# Patient Record
Sex: Male | Born: 1995 | Race: White | Hispanic: No | Marital: Single | Smoking: Current every day smoker
Health system: Southern US, Community
[De-identification: ages and names within clinical notes are randomized; demographics above are authoritative.]

## PROBLEM LIST (undated history)

## (undated) DIAGNOSIS — Z789 Other specified health status: Secondary | ICD-10-CM

## (undated) HISTORY — PX: TONSILLECTOMY: SUR1361

---
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
## 2013-01-03 NOTE — ED Provider Notes (Signed)
Medical screening examination/treatment/procedure(s) were performed by non-physician practitioner and as supervising physician I was immediately available for consultation/collaboration.   Benny Lennert, MD 01/03/13 [phone]
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
## 2015-06-28 ENCOUNTER — Encounter: Payer: Self-pay | Admitting: Family Medicine

## 2015-06-28 ENCOUNTER — Ambulatory Visit (INDEPENDENT_AMBULATORY_CARE_PROVIDER_SITE_OTHER): Payer: 59 | Admitting: Family Medicine

## 2015-06-28 VITALS — BP 125/82 | HR 74 | Temp 97.2°F | Ht 74.0 in | Wt 193.8 lb

## 2015-06-28 DIAGNOSIS — Z0000 Encounter for general adult medical examination without abnormal findings: Secondary | ICD-10-CM | POA: Diagnosis not present

## 2015-06-28 DIAGNOSIS — G894 Chronic pain syndrome: Secondary | ICD-10-CM
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
## 2015-07-04 ENCOUNTER — Telehealth: Payer: Self-pay | Admitting: Family Medicine

## 2015-07-06 NOTE — Telephone Encounter (Signed)
Pt aware  Referral has not been done yet

## 2015-08-26 ENCOUNTER — Emergency Department (HOSPITAL_COMMUNITY): Payer: 59

## 2015-08-26 ENCOUNTER — Encounter (HOSPITAL_COMMUNITY): Payer: Self-pay | Admitting: Emergency Medicine

## 2015-08-26 ENCOUNTER — Emergency Department (HOSPITAL_COMMUNITY)
Admission: EM | Admit: 2015-08-26 | Discharge: 2015-08-26 | Disposition: A | Discharge status: Home / Self Care | Payer: 59 | Attending: Emergency Medicine | Admitting: Emergency Medicine

## 2015-08-26 DIAGNOSIS — G510 Bell's palsy: Secondary | ICD-10-CM

## 2015-08-26 DIAGNOSIS — F17210 Nicotine dependence, cigarettes, uncomplicated: Secondary | ICD-10-CM | POA: Insufficient documentation

## 2015-08-26 DIAGNOSIS — R29810 Facial weakness: Secondary | ICD-10-CM | POA: Diagnosis present

## 2015-08-26 DIAGNOSIS — Z79891 Long term (current) use of opiate analgesic: Secondary | ICD-10-CM | POA: Diagnosis not present

## 2015-08-26 LAB — CBC WITH DIFFERENTIAL/PLATELET
Basophils Absolute: 0 10*3/uL (ref 0.0–0.1)
Basophils Relative: 0 %
Eosinophils Absolute: 0 10*3/uL (ref 0.0–0.7)
Eosinophils Relative: 0 %
HEMATOCRIT: 42.1 % (ref 39.0–52.0)
HEMOGLOBIN: 15.3 g/dL (ref 13.0–17.0)
LYMPHS ABS: 2.6 10*3/uL (ref 0.7–4.0)
LYMPHS PCT: 28 %
MCH: 31.2 pg (ref 26.0–34.0)
MCHC: 36.3 g/dL — AB (ref 30.0–36.0)
MCV: 85.9 fL (ref 78.0–100.0)
MONO ABS: 0.4 10*3/uL (ref 0.1–1.0)
MONOS PCT: 4 %
NEUTROS ABS: 6.1 10*3/uL (ref 1.7–7.7)
NEUTROS PCT: 68 %
Platelets: 204 10*3/uL (ref 150–400)
RBC: 4.9 MIL/uL (ref 4.22–5.81)
RDW: 11.6 % (ref 11.5–15.5)
WBC: 9.1 10*3/uL (ref 4.0–10.5)

## 2015-08-26 LAB — BASIC METABOLIC PANEL
Anion gap: 8 (ref 5–15)
BUN: 17 mg/dL (ref 6–20)
CALCIUM: 9.7 mg/dL (ref 8.9–10.3)
CHLORIDE: 101 mmol/L (ref 101–111)
CO2: 29 mmol/L (ref 22–32)
CREATININE: 0.65 mg/dL (ref 0.61–1.24)
GFR calc Af Amer: >60 mL/min (ref >60)
GFR calc non Af Amer: >60 mL/min (ref >60)
GLUCOSE: 123 mg/dL — AB (ref 65–99)
Potassium: 4.2 mmol/L (ref 3.5–5.1)
Sodium: 138 mmol/L (ref 135–145)

## 2015-08-26 MED ORDER — ONDANSETRON HCL 4 MG PO TABS
4.0000 mg | ORAL_TABLET | Freq: Once | ORAL | Status: AC
  Administered 2015-08-26: 4 mg via ORAL
  Filled 2015-08-26: qty 1

## 2015-08-26 MED ORDER — PREDNISONE 20 MG PO TABS: 40.0000 mg | ORAL_TABLET | Freq: Every day | ORAL | Status: AC

## 2015-08-26 MED ORDER — PREDNISONE 10 MG PO TABS
60.0000 mg | ORAL_TABLET | Freq: Once | ORAL | Status: AC
  Administered 2015-08-26: 60 mg via ORAL
  Filled 2015-08-26: qty 1

## 2015-08-26 MED ORDER — ACYCLOVIR 400 MG PO TABS: 400.0000 mg | ORAL_TABLET | Freq: Four times a day (QID) | ORAL | Status: AC

## 2015-08-26 MED ORDER — ACYCLOVIR 200 MG PO CAPS
ORAL_CAPSULE | ORAL | Status: AC
  Filled 2015-08-26: qty 1

## 2015-08-26 MED ORDER — ACYCLOVIR 200 MG PO CAPS
200.0000 mg | ORAL_CAPSULE | Freq: Once | ORAL | Status: AC
  Administered 2015-08-26: 200 mg via ORAL
  Filled 2015-08-26: qty 1

## 2015-08-26 MED ORDER — ARTIFICIAL TEARS OP OINT: TOPICAL_OINTMENT | Freq: Three times a day (TID) | OPHTHALMIC | Status: AC

## 2015-08-26 NOTE — Discharge Instructions (Signed)
Your lab work is negative for acute problem. Your CT head scan is negative for acute problem. Your exam suggest Bell's Palsy. Please use Lacrilube or similar product to the right eye three or four times daily to prevent drying injury to the eye. Please see Dr Dettinger in the office as soon as possible to monitor you progress with this. Use prednisone and acyclovir daily with food. Bell Palsy Bell palsy is a condition in which the muscles on one side of the face become paralyzed. This often causes one side of the face to droop. It is a common condition and most people recover completely. RISK FACTORS Risk factors for Bell palsy include:  Pregnancy.  Diabetes.  An infection by a virus, such as infections that cause cold sores. CAUSES  Bell palsy is caused by damage to or inflammation of a nerve in your face. It is unclear why this happens, but an infection by a virus may lead to it. Most of the time the reason it happens is unknown. SIGNS AND SYMPTOMS  Symptoms can range from mild to severe and can take place over a number of hours. Symptoms may include:  Being unable to:  Raise one or both eyebrows.  Close one or both eyes.  Feel parts of your face (facial numbness).  Drooping of the eyelid and corner of the mouth.  Weakness in the face.  Paralysis of half your face.  Loss of taste.  Sensitivity to loud noises.  Difficulty chewing.  Tearing up of the affected eye.  Dryness in the affected eye.  Drooling.  Pain behind one ear. DIAGNOSIS  Diagnosis of Bell palsy may include:  A medical history and physical exam.  An MRI.  A CT scan.  Electromyography (EMG). This is a test that checks how your nerves are working. TREATMENT  Treatment may include antiviral medicine to help shorten the length of the condition. Sometimes treatment is not needed and the symptoms go away on their own. HOME CARE INSTRUCTIONS   Take medicines only as directed by your health care  provider.  Do facial massages and exercises as directed by your health care provider.  If your eye is affected:  Use moisturizing eye drops to prevent drying of your eye as directed by your health care provider.  Protect your eye as directed by your health care provider. SEEK MEDICAL CARE IF:  Your symptoms do not get better or get worse.  You are drooling.  Your eye is red, irritated, or hurts. SEEK IMMEDIATE MEDICAL CARE IF:   Another part of your body feels weak or numb.  You have difficulty swallowing.  You have a fever along with symptoms of Bell palsy.  You develop neck pain. MAKE SURE YOU:   Understand these instructions.  Will watch your condition.  Will get help right away if you are not doing well or get worse.   This information is not intended to replace advice given to you by your health care provider. Make sure you discuss any questions you have with your health care provider.   Document Released: 08/11/2005 Document Revised: 05/02/2015 Document Reviewed: 11/18/2013 Elsevier Interactive Patient Education [employer]2016 [employer]vier Inc.

## 2015-08-26 NOTE — ED Notes (Signed)
Pt states he has had cold cough and ear ache, woke up with right side of face numb, droop on right side with smile, no other symptoms
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
## 2015-09-05 ENCOUNTER — Telehealth: Payer: Self-pay | Admitting: *Deleted

## 2015-09-05 NOTE — Telephone Encounter (Signed)
Aware of need for follow up appointment but chooses not to schedule at this time.

## 2016-01-15 IMAGING — CT CT HEAD W/O CM
1 series · 16 of 30 positions shown, 20 images · non-contrast
Comparison: Head CT 04/04/2012

CLINICAL DATA: Right facial weakness.

EXAM:
CT HEAD WITHOUT CONTRAST
TECHNIQUE: Contiguous axial images were obtained from the base of the skull
through the vertex without intravenous contrast.

[Series 2: headseq 4.8 h37s · axial · 0.43mm/px · z∈[+102,+254]mm · 16 of 36 slices shown, 20 images]
[im 2/36  brain]
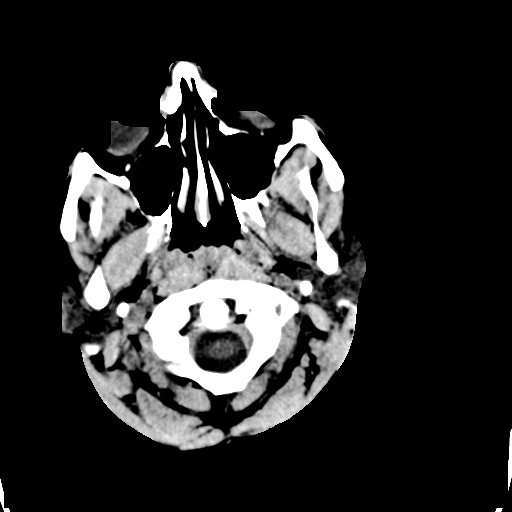
[im 2/36  bone]
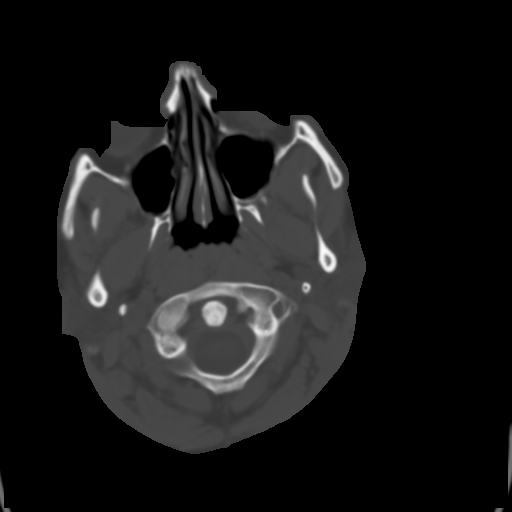
[im 4/36  brain]
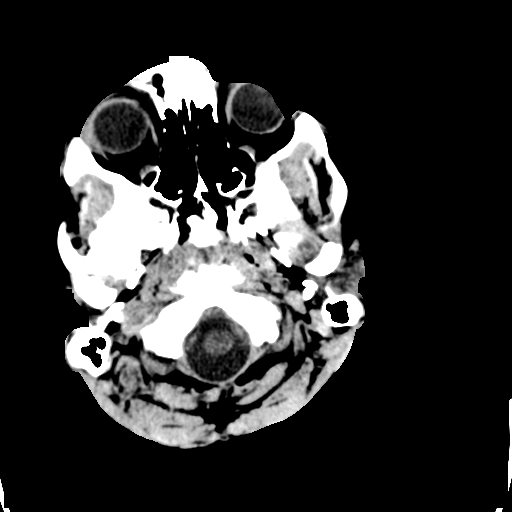
[im 7/36  brain]
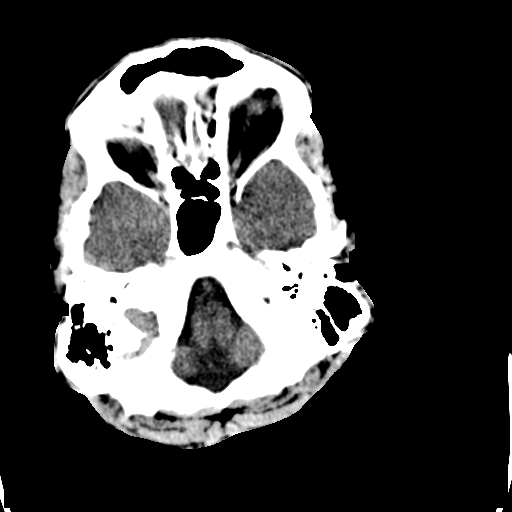
[im 9/36  brain]
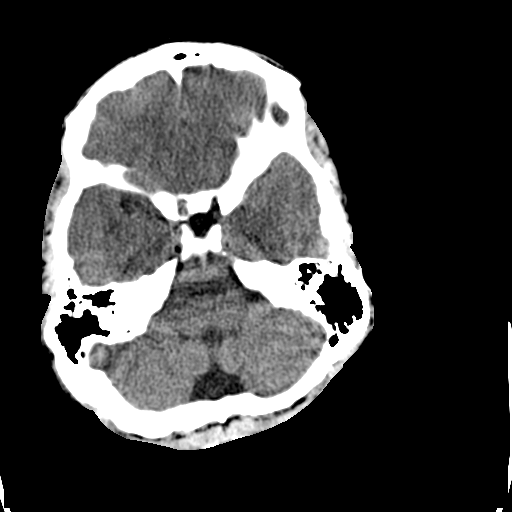
[im 10/36  brain]
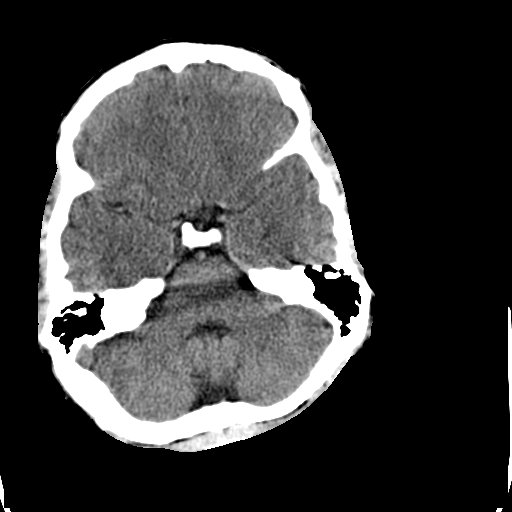
[im 10/36  bone]
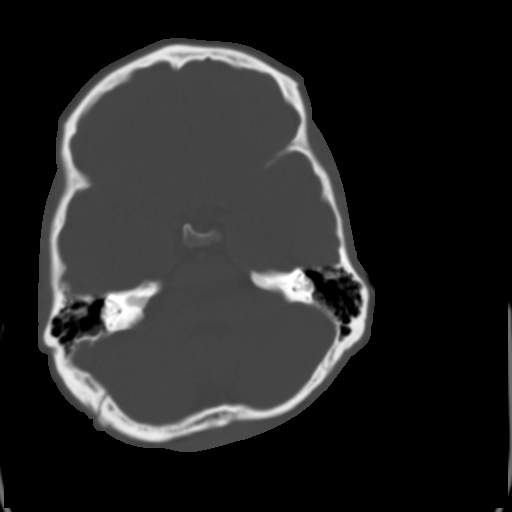
[im 13/36  brain]
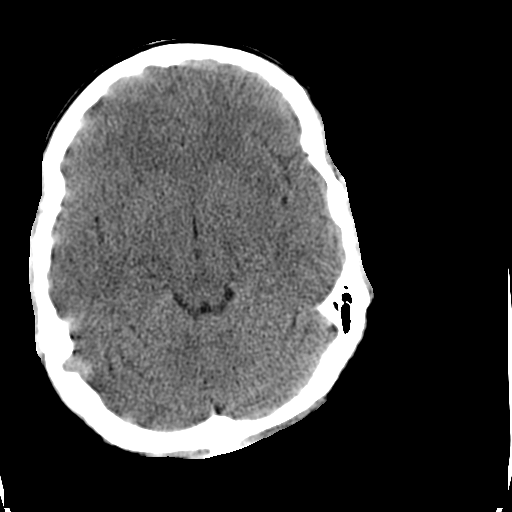
[im 15/36  brain]
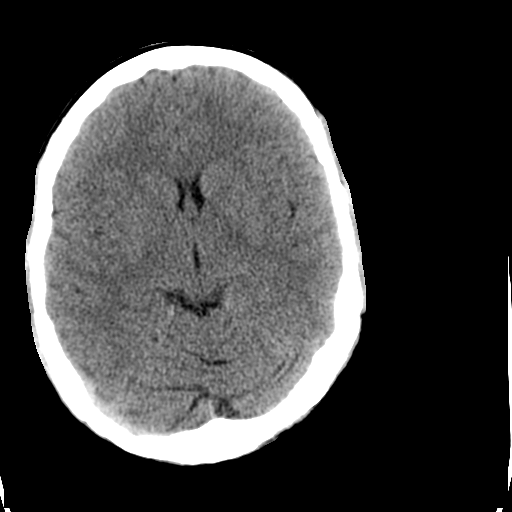
[im 17/36  brain]
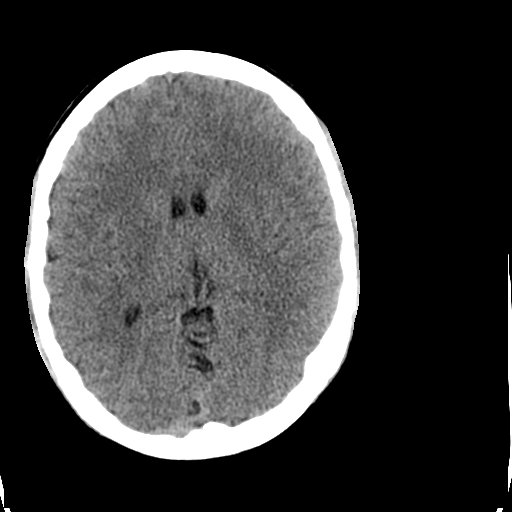
[im 19/36  brain]
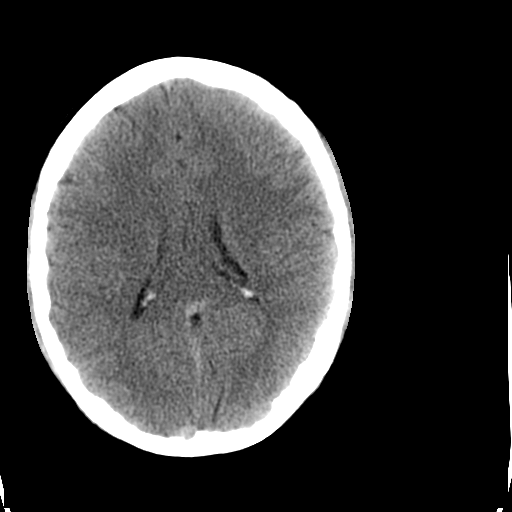
[im 19/36  bone]
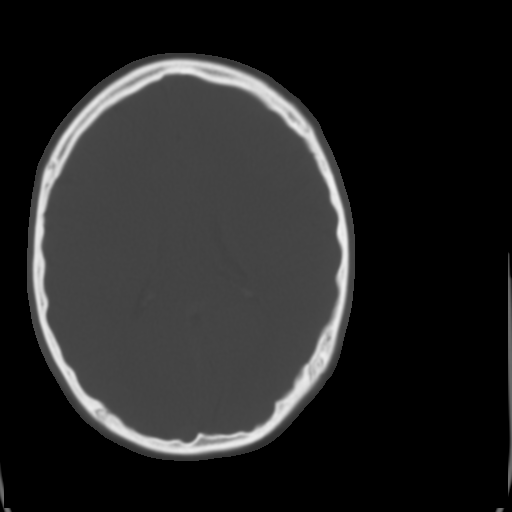
[im 21/36  brain]
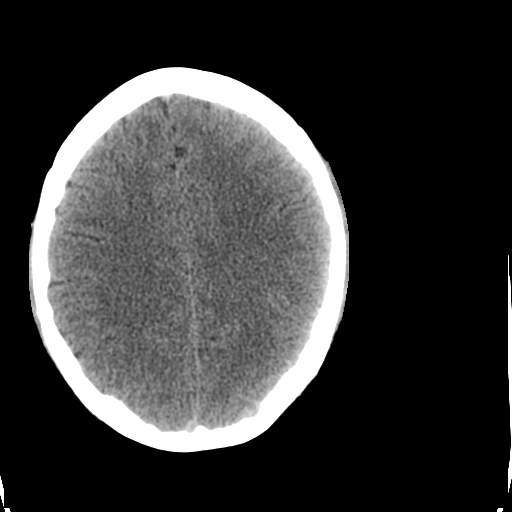
[im 23/36  brain]
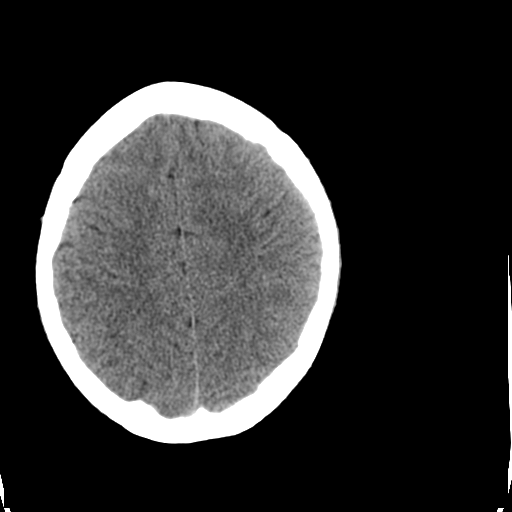
[im 26/36  brain]
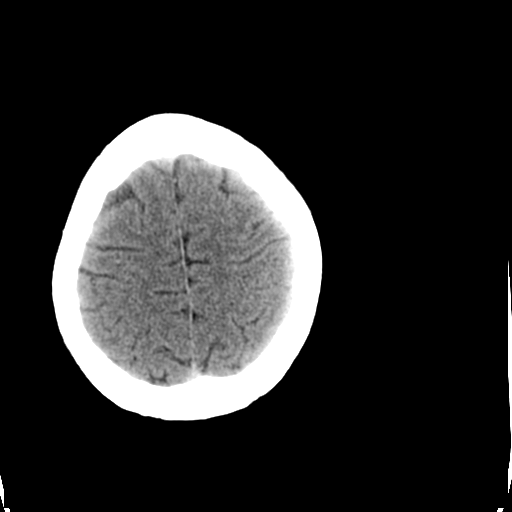
[im 27/36  brain]
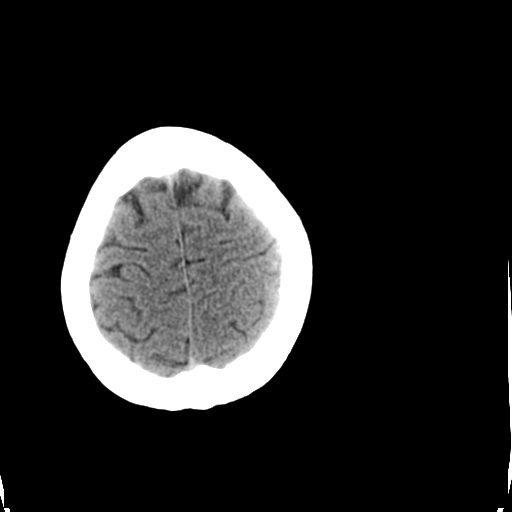
[im 27/36  bone]
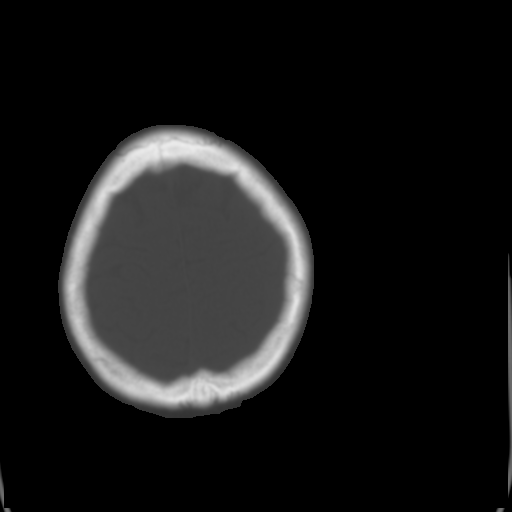
[im 29/36  brain]
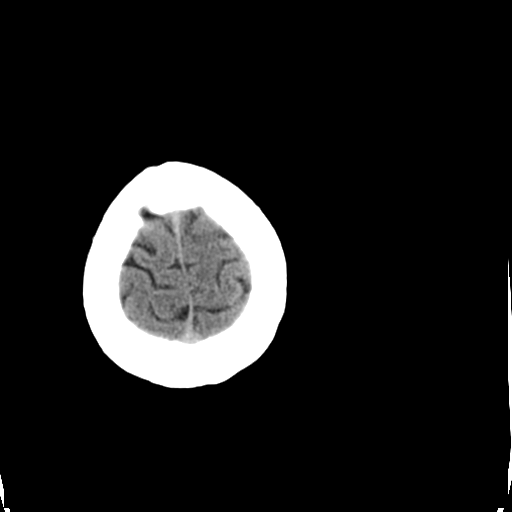
[im 32/36  brain]
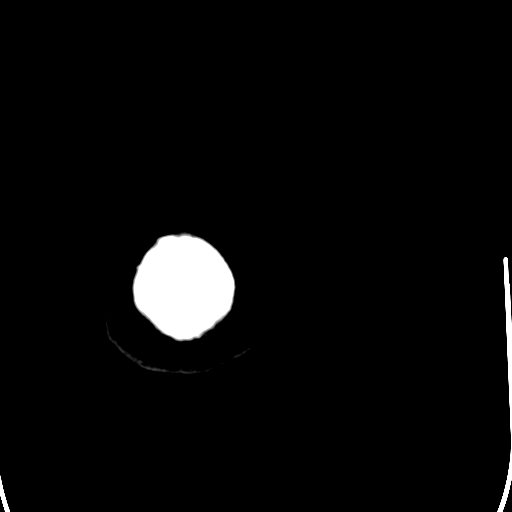
[im 34/36  brain]
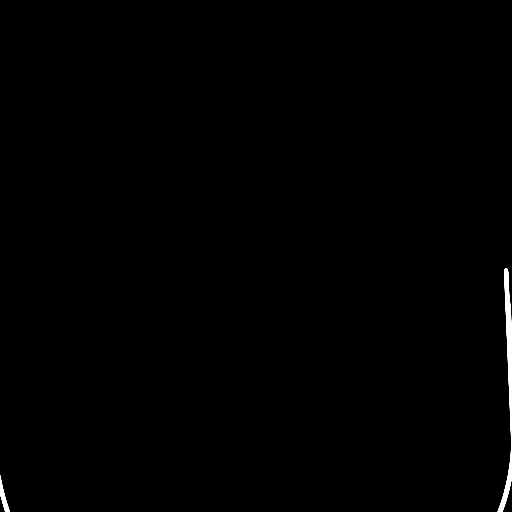

[16 of 30 positions shown; findings below may reference images not displayed]

FINDINGS: No intracranial hemorrhage, mass effect, or midline shift. No
hydrocephalus. The basilar cisterns are patent. No evidence of
territorial infarct. No intracranial fluid collection. Calvarium is
intact. Included paranasal sinuses and mastoid air cells are well
aerated.
IMPRESSION: No acute intracranial abnormality.

## 2021-10-18 ENCOUNTER — Other Ambulatory Visit: Payer: Self-pay

## 2021-10-18 ENCOUNTER — Ambulatory Visit
Admission: EM | Admit: 2021-10-18 | Discharge: 2021-10-18 | Disposition: A | Discharge status: Home / Self Care | Payer: BC Managed Care – PPO | Attending: Family Medicine | Admitting: Family Medicine

## 2021-10-18 DIAGNOSIS — Z20818 Contact with and (suspected) exposure to other bacterial communicable diseases: Secondary | ICD-10-CM

## 2021-10-18 DIAGNOSIS — N4889 Other specified disorders of penis: Secondary | ICD-10-CM

## 2021-10-18 LAB — POCT RAPID STREP A (OFFICE): Rapid Strep A Screen: NEGATIVE

## 2021-10-18 MED ORDER — AMOXICILLIN 875 MG PO TABS: 875.0000 mg | ORAL_TABLET | Freq: Two times a day (BID) | ORAL | 0 refills | Status: AC

## 2021-10-18 NOTE — ED Triage Notes (Signed)
Patient states that he is having problems with a swollen with a rash on his penis about 2 days ago  Denies burning with urination  Denies Fever
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
